# Patient Record
Sex: Female | Born: 1970 | Hispanic: Yes | Marital: Single | Smoking: Never smoker
Health system: Southern US, Community
[De-identification: ages and names within clinical notes are randomized; demographics above are authoritative.]

## PROBLEM LIST (undated history)

## (undated) DIAGNOSIS — I10 Essential (primary) hypertension: Secondary | ICD-10-CM

## (undated) DIAGNOSIS — Z5189 Encounter for other specified aftercare: Secondary | ICD-10-CM

## (undated) HISTORY — PX: CHOLECYSTECTOMY: SHX55

---

## 2013-01-31 ENCOUNTER — Emergency Department (HOSPITAL_COMMUNITY)
Admission: EM | Admit: 2013-01-31 | Discharge: 2013-01-31 | Disposition: A | Discharge status: Home / Self Care | Attending: Emergency Medicine | Admitting: Emergency Medicine

## 2013-01-31 ENCOUNTER — Encounter (HOSPITAL_COMMUNITY): Payer: Self-pay | Admitting: Emergency Medicine

## 2013-01-31 DIAGNOSIS — D649 Anemia, unspecified: Secondary | ICD-10-CM | POA: Diagnosis not present

## 2013-01-31 DIAGNOSIS — Z3202 Encounter for pregnancy test, result negative: Secondary | ICD-10-CM | POA: Diagnosis not present

## 2013-01-31 DIAGNOSIS — Z9889 Other specified postprocedural states: Secondary | ICD-10-CM | POA: Insufficient documentation

## 2013-01-31 DIAGNOSIS — R109 Unspecified abdominal pain: Secondary | ICD-10-CM

## 2013-01-31 DIAGNOSIS — I10 Essential (primary) hypertension: Secondary | ICD-10-CM | POA: Insufficient documentation

## 2013-01-31 DIAGNOSIS — Z79899 Other long term (current) drug therapy: Secondary | ICD-10-CM | POA: Insufficient documentation

## 2013-01-31 DIAGNOSIS — Z9089 Acquired absence of other organs: Secondary | ICD-10-CM | POA: Insufficient documentation

## 2013-01-31 DIAGNOSIS — R1013 Epigastric pain: Secondary | ICD-10-CM | POA: Diagnosis not present

## 2013-01-31 DIAGNOSIS — R112 Nausea with vomiting, unspecified: Secondary | ICD-10-CM | POA: Diagnosis not present

## 2013-01-31 HISTORY — DX: Essential (primary) hypertension: I10

## 2013-01-31 LAB — URINALYSIS, ROUTINE W REFLEX MICROSCOPIC
Glucose, UA: NEGATIVE mg/dL
Ketones, ur: NEGATIVE mg/dL
Leukocytes, UA: NEGATIVE
Nitrite: NEGATIVE
Specific Gravity, Urine: 1.023 (ref 1.005–1.030)
Urobilinogen, UA: 0.2 mg/dL (ref 0.0–1.0)
pH: 6.5 (ref 5.0–8.0)

## 2013-01-31 LAB — CBC WITH DIFFERENTIAL/PLATELET
Basophils Absolute: 0.1 10*3/uL (ref 0.0–0.1)
Basophils Relative: 1 % (ref 0–1)
Eosinophils Relative: 0 % (ref 0–5)
HCT: 26.5 % — ABNORMAL LOW (ref 36.0–46.0)
Lymphocytes Relative: 9 % — ABNORMAL LOW (ref 12–46)
MCHC: 26.8 g/dL — ABNORMAL LOW (ref 30.0–36.0)
MCV: 62.2 fL — ABNORMAL LOW (ref 78.0–100.0)
Monocytes Absolute: 0.5 10*3/uL (ref 0.1–1.0)
Neutro Abs: 8.8 10*3/uL — ABNORMAL HIGH (ref 1.7–7.7)
Neutrophils Relative %: 85 % — ABNORMAL HIGH (ref 43–77)
Platelets: 512 10*3/uL — ABNORMAL HIGH (ref 150–400)
RDW: 18.4 % — ABNORMAL HIGH (ref 11.5–15.5)

## 2013-01-31 LAB — COMPREHENSIVE METABOLIC PANEL
ALT: 12 U/L (ref 0–35)
AST: 20 U/L (ref 0–37)
Albumin: 3.5 g/dL (ref 3.5–5.2)
CO2: 25 mEq/L (ref 19–32)
Calcium: 9 mg/dL (ref 8.4–10.5)
Creatinine, Ser: 0.75 mg/dL (ref 0.50–1.10)
GFR calc non Af Amer: >90 mL/min (ref >90)
Glucose, Bld: 101 mg/dL — ABNORMAL HIGH (ref 70–99)
Potassium: 3.4 mEq/L — ABNORMAL LOW (ref 3.5–5.1)
Sodium: 135 mEq/L (ref 135–145)

## 2013-01-31 LAB — LIPASE, BLOOD: Lipase: 37 U/L (ref 11–59)

## 2013-01-31 LAB — POCT I-STAT TROPONIN I: Troponin i, poc: 0 ng/mL (ref 0.00–0.08)

## 2013-01-31 LAB — POCT PREGNANCY, URINE: Preg Test, Ur: NEGATIVE

## 2013-01-31 MED ORDER — SUCRALFATE 1 G PO TABS: 1.0000 g | ORAL_TABLET | Freq: Four times a day (QID) | ORAL | Status: AC

## 2013-01-31 MED ORDER — SODIUM CHLORIDE 0.9 % IV BOLUS (SEPSIS)
1000.0000 mL | Freq: Once | INTRAVENOUS | Status: AC
  Administered 2013-01-31: 1000 mL via INTRAVENOUS

## 2013-01-31 MED ORDER — FENTANYL CITRATE 0.05 MG/ML IJ SOLN
50.0000 ug | Freq: Once | INTRAMUSCULAR | Status: AC
  Administered 2013-01-31: 50 ug via INTRAVENOUS
  Filled 2013-01-31: qty 2

## 2013-01-31 MED ORDER — OMEPRAZOLE 20 MG PO CPDR: 20.0000 mg | DELAYED_RELEASE_CAPSULE | Freq: Every day | ORAL | Status: AC

## 2013-01-31 MED ORDER — FAMOTIDINE IN NACL 20-0.9 MG/50ML-% IV SOLN
20.0000 mg | Freq: Once | INTRAVENOUS | Status: AC
  Administered 2013-01-31: 20 mg via INTRAVENOUS
  Filled 2013-01-31: qty 50

## 2013-01-31 MED ORDER — GI COCKTAIL ~~LOC~~
30.0000 mL | Freq: Once | ORAL | Status: AC
  Administered 2013-01-31: 30 mL via ORAL
  Filled 2013-01-31: qty 30

## 2013-01-31 MED ORDER — HYDROMORPHONE HCL PF 1 MG/ML IJ SOLN
0.5000 mg | INTRAMUSCULAR | Status: AC
  Administered 2013-01-31: 0.5 mg via INTRAVENOUS
  Filled 2013-01-31: qty 1

## 2013-01-31 NOTE — ED Notes (Signed)
Pt able to void, gait steady. Pt sts pain medication helped general pain, and made pt sleepy but still had intermittent stabbing pain.

## 2013-01-31 NOTE — ED Notes (Signed)
Pt reports a sharp stabbing pain in the epigastric area. Reports some nausea and vomiting. Also reports some SOB with the pain. States that the pain comes and goes. No distress noted.

## 2013-01-31 NOTE — ED Notes (Signed)
Dr.Lockwood at bedside  

## 2013-01-31 NOTE — ED Provider Notes (Signed)
CSN: [id number]     Arrival date & time 01/31/13  1632 History   First MD Initiated Contact with Patient 01/31/13 1727     Chief Complaint  Patient presents with  . Abdominal Pain   (Consider location/radiation/quality/duration/timing/severity/associated sxs/prior Treatment) HPI Patient presents with abdominal pain.  The pain began 4 hours ago after the patient finished lunch.  Since onset has been waxing/waning, with severe episodes occurring without clear precipitant.  Each episode lasts moments.  The pain is focally about the epigastrium, nonradiating, sharp, severe. Is associated nausea, and the patient has vomited. No other abdominal pain, no other chest pain, dyspnea, fever. No relief with anything.  When patient is otherwise well, states that she was in her usual state of health prior to the onset of symptoms. Patient has a notable history of prior cholecystectomy.  Past Medical History  Diagnosis Date  . Hypertension    Past Surgical History  Procedure Laterality Date  . Cholecystectomy    . Cesarean section     History reviewed. No pertinent family history. History  Substance Use Topics  . Smoking status: Never Smoker   . Smokeless tobacco: Not on file  . Alcohol Use: Yes   OB History   Grav Para Term Preterm Abortions TAB SAB Ect Mult Living                 Review of Systems  Constitutional:       Per HPI, otherwise negative  HENT:       Per HPI, otherwise negative  Respiratory:       Per HPI, otherwise negative  Cardiovascular:       Per HPI, otherwise negative  Gastrointestinal: Negative for vomiting.  Endocrine:       Negative aside from HPI  Genitourinary:       Neg aside from HPI   Musculoskeletal:       Per HPI, otherwise negative  Skin: Negative.   Neurological: Negative for syncope.    Allergies  Review of patient's allergies indicates no known allergies.  Home Medications   Current Outpatient Rx  Name  Route  Sig  Dispense  Refill  .  lisinopril-hydrochlorothiazide (PRINZIDE,ZESTORETIC) 20-12.5 MG per tablet   Oral   Take 1 tablet by mouth daily.          BP 147/76  Pulse 72  Temp(Src) 98.1 F (36.7 C) (Oral)  Resp 24  Ht 5\' 7"  (1.702 m)  Wt 170 lb (77.111 kg)  BMI 26.62 kg/m2  SpO2 100%  LMP 01/12/2013 Physical Exam  Nursing note and vitals reviewed. Constitutional: She is oriented to person, place, and time. She appears well-developed and well-nourished. No distress.  HENT:  Head: Normocephalic and atraumatic.  Eyes: Conjunctivae and EOM are normal.  Cardiovascular: Normal rate and regular rhythm.   Pulmonary/Chest: Effort normal and breath sounds normal. No stridor. No respiratory distress.  Abdominal: Soft. She exhibits no distension.    Musculoskeletal: She exhibits no edema.  Neurological: She is alert and oriented to person, place, and time. No cranial nerve deficit.  Skin: Skin is warm and dry.  Psychiatric: She has a normal mood and affect.    ED Course  Procedures (including critical care time) Labs Review Labs Reviewed  CBC WITH DIFFERENTIAL - Abnormal; Notable for the following:    Hemoglobin 7.1 (*)    HCT 26.5 (*)    MCV 62.2 (*)    MCH 16.7 (*)    MCHC 26.8 (*)  RDW 18.4 (*)    Platelets 512 (*)    Neutrophils Relative % 85 (*)    Neutro Abs 8.8 (*)    Lymphocytes Relative 9 (*)    All other components within normal limits  COMPREHENSIVE METABOLIC PANEL  LIPASE, BLOOD  URINALYSIS, ROUTINE W REFLEX MICROSCOPIC  POCT I-STAT TROPONIN I   Imaging Review No results found.  EKG Interpretation   None       6:59 PM Patient sleeping  8:24 PM Patient states that she feels substantially better.  We had a lengthy conversation about the need for vitamin, iron supplements, primary care followup. MDM  No diagnosis found. This patient presents with the acute onset of epigastric pain, nausea.  On exam she is awake and alert, no tenderness about the epigastrium.  Patient's labs  are most notable for demonstration of significant anemia, with microcytosis suggesting vitamin deficiency.  Patient improved substantially here.  With this improvement, the absence of ongoing distress, or stable vital signs is appropriate for discharge with initiation of new medication, primary care followup.    Gerhard Munch, MD 01/31/13 2025

## 2014-05-04 ENCOUNTER — Emergency Department (HOSPITAL_COMMUNITY)

## 2014-05-04 ENCOUNTER — Emergency Department (HOSPITAL_COMMUNITY)
Admission: EM | Admit: 2014-05-04 | Discharge: 2014-05-04 | Disposition: A | Discharge status: Home / Self Care | Attending: Emergency Medicine | Admitting: Emergency Medicine

## 2014-05-04 ENCOUNTER — Encounter (HOSPITAL_COMMUNITY): Payer: Self-pay

## 2014-05-04 DIAGNOSIS — S43401A Unspecified sprain of right shoulder joint, initial encounter: Secondary | ICD-10-CM | POA: Diagnosis not present

## 2014-05-04 DIAGNOSIS — S63501A Unspecified sprain of right wrist, initial encounter: Secondary | ICD-10-CM | POA: Insufficient documentation

## 2014-05-04 DIAGNOSIS — Z79899 Other long term (current) drug therapy: Secondary | ICD-10-CM | POA: Diagnosis not present

## 2014-05-04 DIAGNOSIS — Y998 Other external cause status: Secondary | ICD-10-CM | POA: Insufficient documentation

## 2014-05-04 DIAGNOSIS — Y9389 Activity, other specified: Secondary | ICD-10-CM | POA: Diagnosis not present

## 2014-05-04 DIAGNOSIS — Y9289 Other specified places as the place of occurrence of the external cause: Secondary | ICD-10-CM | POA: Diagnosis not present

## 2014-05-04 DIAGNOSIS — S300XXA Contusion of lower back and pelvis, initial encounter: Secondary | ICD-10-CM

## 2014-05-04 DIAGNOSIS — S4991XA Unspecified injury of right shoulder and upper arm, initial encounter: Secondary | ICD-10-CM | POA: Diagnosis present

## 2014-05-04 DIAGNOSIS — I10 Essential (primary) hypertension: Secondary | ICD-10-CM | POA: Insufficient documentation

## 2014-05-04 HISTORY — DX: Encounter for other specified aftercare: Z51.89

## 2014-05-04 MED ORDER — IBUPROFEN 600 MG PO TABS: 600.0000 mg | ORAL_TABLET | Freq: Four times a day (QID) | ORAL | Status: AC | PRN

## 2014-05-04 MED ORDER — KETOROLAC TROMETHAMINE 60 MG/2ML IM SOLN
60.0000 mg | Freq: Once | INTRAMUSCULAR | Status: AC
  Administered 2014-05-04: 60 mg via INTRAMUSCULAR
  Filled 2014-05-04: qty 2

## 2014-05-04 MED ORDER — HYDROCODONE-ACETAMINOPHEN 5-325 MG PO TABS: 1.0000 | ORAL_TABLET | Freq: Four times a day (QID) | ORAL | Status: AC | PRN

## 2014-05-04 MED ORDER — LISINOPRIL-HYDROCHLOROTHIAZIDE 20-12.5 MG PO TABS: 1.0000 | ORAL_TABLET | Freq: Every day | ORAL | Status: AC

## 2014-05-04 NOTE — Discharge Instructions (Signed)
Ibuprofen for pain. norco for severe pain. Ice. Rest. Please follow up with an orthopedics specialist in your home town for further evaluation of your shoulder injury. Your xrays today are normal.   Shoulder Sprain A shoulder sprain is the result of damage to the tough, fiber-like tissues (ligaments) that help hold your shoulder in place. The ligaments may be stretched or torn. Besides the main shoulder joint (the ball and socket), there are several smaller joints that connect the bones in this area. A sprain usually involves one of those joints. Most often it is the acromioclavicular (or AC) joint. That is the joint that connects the collarbone (clavicle) and the shoulder blade (scapula) at the top point of the shoulder blade (acromion). A shoulder sprain is a mild form of what is called a shoulder separation. Recovering from a shoulder sprain may take some time. For some, pain lingers for several months. Most people recover without long term problems. CAUSES   A shoulder sprain is usually caused by some kind of trauma. This might be:  Falling on an outstretched arm.  Being hit hard on the shoulder.  Twisting the arm.  Shoulder sprains are more likely to occur in people who:  Play sports.  Have balance or coordination problems. SYMPTOMS   Pain when you move your shoulder.  Limited ability to move the shoulder.  Swelling and tenderness on top of the shoulder.  Redness or warmth in the shoulder.  Bruising.  A change in the shape of the shoulder. DIAGNOSIS  Your healthcare provider may:  Ask about your symptoms.  Ask about recent activity that might have caused those symptoms.  Examine your shoulder. You may be asked to do simple exercises to test movement. The other shoulder will be examined for comparison.  Order some tests that provide a look inside the body. They can show the extent of the injury. The tests could include:  X-rays.  CT (computed tomography) scan.  MRI  (magnetic resonance imaging) scan. RISKS AND COMPLICATIONS  Loss of full shoulder motion.  Ongoing shoulder pain. TREATMENT  How long it takes to recover from a shoulder sprain depends on how severe it was. Treatment options may include:  Rest. You should not use the arm or shoulder until it heals.  Ice. For 2 or 3 days after the injury, put an ice pack on the shoulder up to 4 times a day. It should stay on for 15 to 20 minutes each time. Wrap the ice in a towel so it does not touch your skin.  Over-the-counter medicine to relieve pain.  A sling or brace. This will keep the arm still while the shoulder is healing.  Physical therapy or rehabilitation exercises. These will help you regain strength and motion. Ask your healthcare provider when it is OK to begin these exercises.  Surgery. The need for surgery is rare with a sprained shoulder, but some people may need surgery to keep the joint in place and reduce pain. HOME CARE INSTRUCTIONS   Ask your healthcare provider about what you should and should not do while your shoulder heals.  Make sure you know how to apply ice to the correct area of your shoulder.  Talk with your healthcare provider about which medications should be used for pain and swelling.  If rehabilitation therapy will be needed, ask your healthcare provider to refer you to a therapist. If it is not recommended, then ask about at-home exercises. Find out when exercise should begin. SEEK MEDICAL CARE  IF:  Your pain, swelling, or redness at the joint increases. SEEK IMMEDIATE MEDICAL CARE IF:   You have a fever.  You cannot move your arm or shoulder. Document Released: 07/01/2008 Document Revised: 05/07/2011 Document Reviewed: 07/01/2008 [facility] Patient Information 2015 [employer], [place]. This information is not intended to replace advice given to you by your health care provider. Make sure you discuss any questions you have with your health care provider.
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
## 2014-05-04 NOTE — ED Notes (Signed)
Patient transported to X-ray 
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
